# Patient Record
Sex: Male | Born: 1995 | Race: White | Hispanic: No | Marital: Single
Health system: Southern US, Community
[De-identification: ages and names within clinical notes are randomized; demographics above are authoritative.]

## PROBLEM LIST (undated history)

## (undated) DIAGNOSIS — Z8659 Personal history of other mental and behavioral disorders: Secondary | ICD-10-CM

---
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
## 2016-08-22 ENCOUNTER — Other Ambulatory Visit: Payer: Self-pay | Admitting: Physician Assistant

## 2016-08-22 DIAGNOSIS — R519 Headache, unspecified: Secondary | ICD-10-CM

## 2016-08-22 DIAGNOSIS — R51 Headache: Principal | ICD-10-CM

## 2016-09-14 ENCOUNTER — Ambulatory Visit
Admission: RE | Admit: 2016-09-14 | Discharge: 2016-09-14 | Disposition: A | Discharge status: Home / Self Care | Payer: Medicaid Other | Source: Ambulatory Visit | Attending: Physician Assistant | Admitting: Physician Assistant

## 2016-09-14 DIAGNOSIS — R51 Headache: Principal | ICD-10-CM

## 2016-09-14 DIAGNOSIS — R519 Headache, unspecified: Secondary | ICD-10-CM

## 2018-01-30 ENCOUNTER — Ambulatory Visit
Admission: RE | Admit: 2018-01-30 | Discharge: 2018-01-30 | Disposition: A | Discharge status: Home / Self Care | Payer: Self-pay | Source: Ambulatory Visit | Attending: Physician Assistant | Admitting: Physician Assistant

## 2018-01-30 ENCOUNTER — Other Ambulatory Visit: Payer: Self-pay | Admitting: Physician Assistant

## 2018-01-30 DIAGNOSIS — R0789 Other chest pain: Secondary | ICD-10-CM

## 2021-03-21 ENCOUNTER — Other Ambulatory Visit: Payer: Self-pay

## 2021-03-21 ENCOUNTER — Ambulatory Visit (HOSPITAL_COMMUNITY)
Admission: RE | Admit: 2021-03-21 | Discharge: 2021-03-21 | Discharge status: Against Medical Advice | Payer: Self-pay | Source: Ambulatory Visit

## 2021-10-19 ENCOUNTER — Other Ambulatory Visit: Payer: Self-pay

## 2021-10-19 ENCOUNTER — Emergency Department (HOSPITAL_COMMUNITY)
Admission: EM | Admit: 2021-10-19 | Discharge: 2021-10-20 | Discharge status: Against Medical Advice | Payer: BC Managed Care – PPO | Attending: Emergency Medicine | Admitting: Emergency Medicine

## 2021-10-19 ENCOUNTER — Emergency Department (HOSPITAL_COMMUNITY): Payer: BC Managed Care – PPO

## 2021-10-19 ENCOUNTER — Encounter (HOSPITAL_COMMUNITY): Payer: Self-pay | Admitting: Emergency Medicine

## 2021-10-19 DIAGNOSIS — R002 Palpitations: Secondary | ICD-10-CM | POA: Insufficient documentation

## 2021-10-19 DIAGNOSIS — Z5321 Procedure and treatment not carried out due to patient leaving prior to being seen by health care provider: Secondary | ICD-10-CM | POA: Diagnosis not present

## 2021-10-19 DIAGNOSIS — R350 Frequency of micturition: Secondary | ICD-10-CM | POA: Insufficient documentation

## 2021-10-19 DIAGNOSIS — R0602 Shortness of breath: Secondary | ICD-10-CM | POA: Diagnosis not present

## 2021-10-19 LAB — BASIC METABOLIC PANEL
Anion gap: 9 (ref 5–15)
BUN: 15 mg/dL (ref 6–20)
CO2: 26 mmol/L (ref 22–32)
Calcium: 9.8 mg/dL (ref 8.9–10.3)
Chloride: 104 mmol/L (ref 98–111)
Creatinine, Ser: 1.05 mg/dL (ref 0.61–1.24)
GFR, Estimated: >60 mL/min (ref >60)
Glucose, Bld: 84 mg/dL (ref 70–99)
Potassium: 4.4 mmol/L (ref 3.5–5.1)
Sodium: 139 mmol/L (ref 135–145)

## 2021-10-19 LAB — TROPONIN I (HIGH SENSITIVITY): Troponin I (High Sensitivity): 4 ng/L (ref <18)

## 2021-10-19 LAB — CBC WITH DIFFERENTIAL/PLATELET
Abs Immature Granulocytes: 0.03 10*3/uL (ref 0.00–0.07)
Basophils Absolute: 0 10*3/uL (ref 0.0–0.1)
Basophils Relative: 0 %
Eosinophils Absolute: 0.1 10*3/uL (ref 0.0–0.5)
Eosinophils Relative: 2 %
HCT: 45.1 % (ref 39.0–52.0)
Hemoglobin: 15.6 g/dL (ref 13.0–17.0)
Immature Granulocytes: 0 %
Lymphocytes Relative: 32 %
Lymphs Abs: 3 10*3/uL (ref 0.7–4.0)
MCH: 31.1 pg (ref 26.0–34.0)
MCHC: 34.6 g/dL (ref 30.0–36.0)
MCV: 89.8 fL (ref 80.0–100.0)
Monocytes Absolute: 0.7 10*3/uL (ref 0.1–1.0)
Monocytes Relative: 7 %
Neutro Abs: 5.5 10*3/uL (ref 1.7–7.7)
Neutrophils Relative %: 59 %
Platelets: 191 10*3/uL (ref 150–400)
RBC: 5.02 MIL/uL (ref 4.22–5.81)
RDW: 12.5 % (ref 11.5–15.5)
WBC: 9.4 10*3/uL (ref 4.0–10.5)
nRBC: 0 % (ref 0.0–0.2)

## 2021-10-19 LAB — URINALYSIS, ROUTINE W REFLEX MICROSCOPIC
Bilirubin Urine: NEGATIVE
Glucose, UA: NEGATIVE mg/dL
Hgb urine dipstick: NEGATIVE
Ketones, ur: NEGATIVE mg/dL
Leukocytes,Ua: NEGATIVE
Nitrite: NEGATIVE
Protein, ur: NEGATIVE mg/dL
Specific Gravity, Urine: 1.004 — ABNORMAL LOW (ref 1.005–1.030)
pH: 8 (ref 5.0–8.0)

## 2021-10-19 LAB — BRAIN NATRIURETIC PEPTIDE: B Natriuretic Peptide: 13.8 pg/mL (ref 0.0–100.0)

## 2021-10-19 NOTE — ED Provider Triage Note (Signed)
Emergency Medicine Provider Triage Evaluation Note ? ?Clayton Morris , a 26 y.o. male  was evaluated in triage.  Pt complains of palpitations and shortness of breath for the last 2 days.  Denies chest pain.  Began yesterday suddenly without obvious cause.  Waxed and waned until today.  Increased again, causing patient to feel anxious and come to the ED.  Denies fever, nausea, vomiting, diarrhea, constipation, abdominal pain, vision changes, urinary symptoms, headache.  No significant cardiac history. ? ?Review of Systems  ?Positive: As above ?Negative: As above ? ?Physical Exam  ?BP (!) 145/95   Pulse 74   Temp 98.8 ?F (37.1 ?C) (Oral)   Resp 18   Ht 5\' 11"  (1.803 m)   Wt 106.6 kg   SpO2 100%   BMI 32.78 kg/m?  ?Gen:   Awake, no distress, anxious appearing ?Resp:  Normal effort, CTAB, able to communicate clearly and without difficulty ?MSK:   Moves extremities without difficulty  ?Other:  Abdomen soft nontender.  No lower extremity edema.  Radial pulses 2+ bilaterally.  Pulse rate between 70 to 80 bpm.  RRR without M/R/G. ? ?Medical Decision Making  ?Medically screening exam initiated at 9:05 PM.  Appropriate orders placed.  Clayton Morris was informed that the remainder of the evaluation will be completed by another provider, this initial triage assessment does not replace that evaluation, and the importance of remaining in the ED until their evaluation is complete. ? ?Labs, imaging, EKG ordered. ?  ?Clayton Coy, PA-C ?10/19/21 2113 ? ?

## 2021-10-19 NOTE — ED Triage Notes (Signed)
Pt c/o heart palpitations intermittently x 1 days with SOB, pt reports urinary frequency x 1 hour; pt reports he recently started going to the gym and working out ?

## 2021-10-20 LAB — TROPONIN I (HIGH SENSITIVITY): Troponin I (High Sensitivity): 5 ng/L (ref <18)

## 2023-07-13 IMAGING — DX DG CHEST 2V
2 series · 2 of 2 positions shown · non-contrast
Comparison: Chest x-ray 01/30/2018.

CLINICAL DATA: Shortness of breath.

EXAM:
CHEST - 2 VIEW

[chest pa]
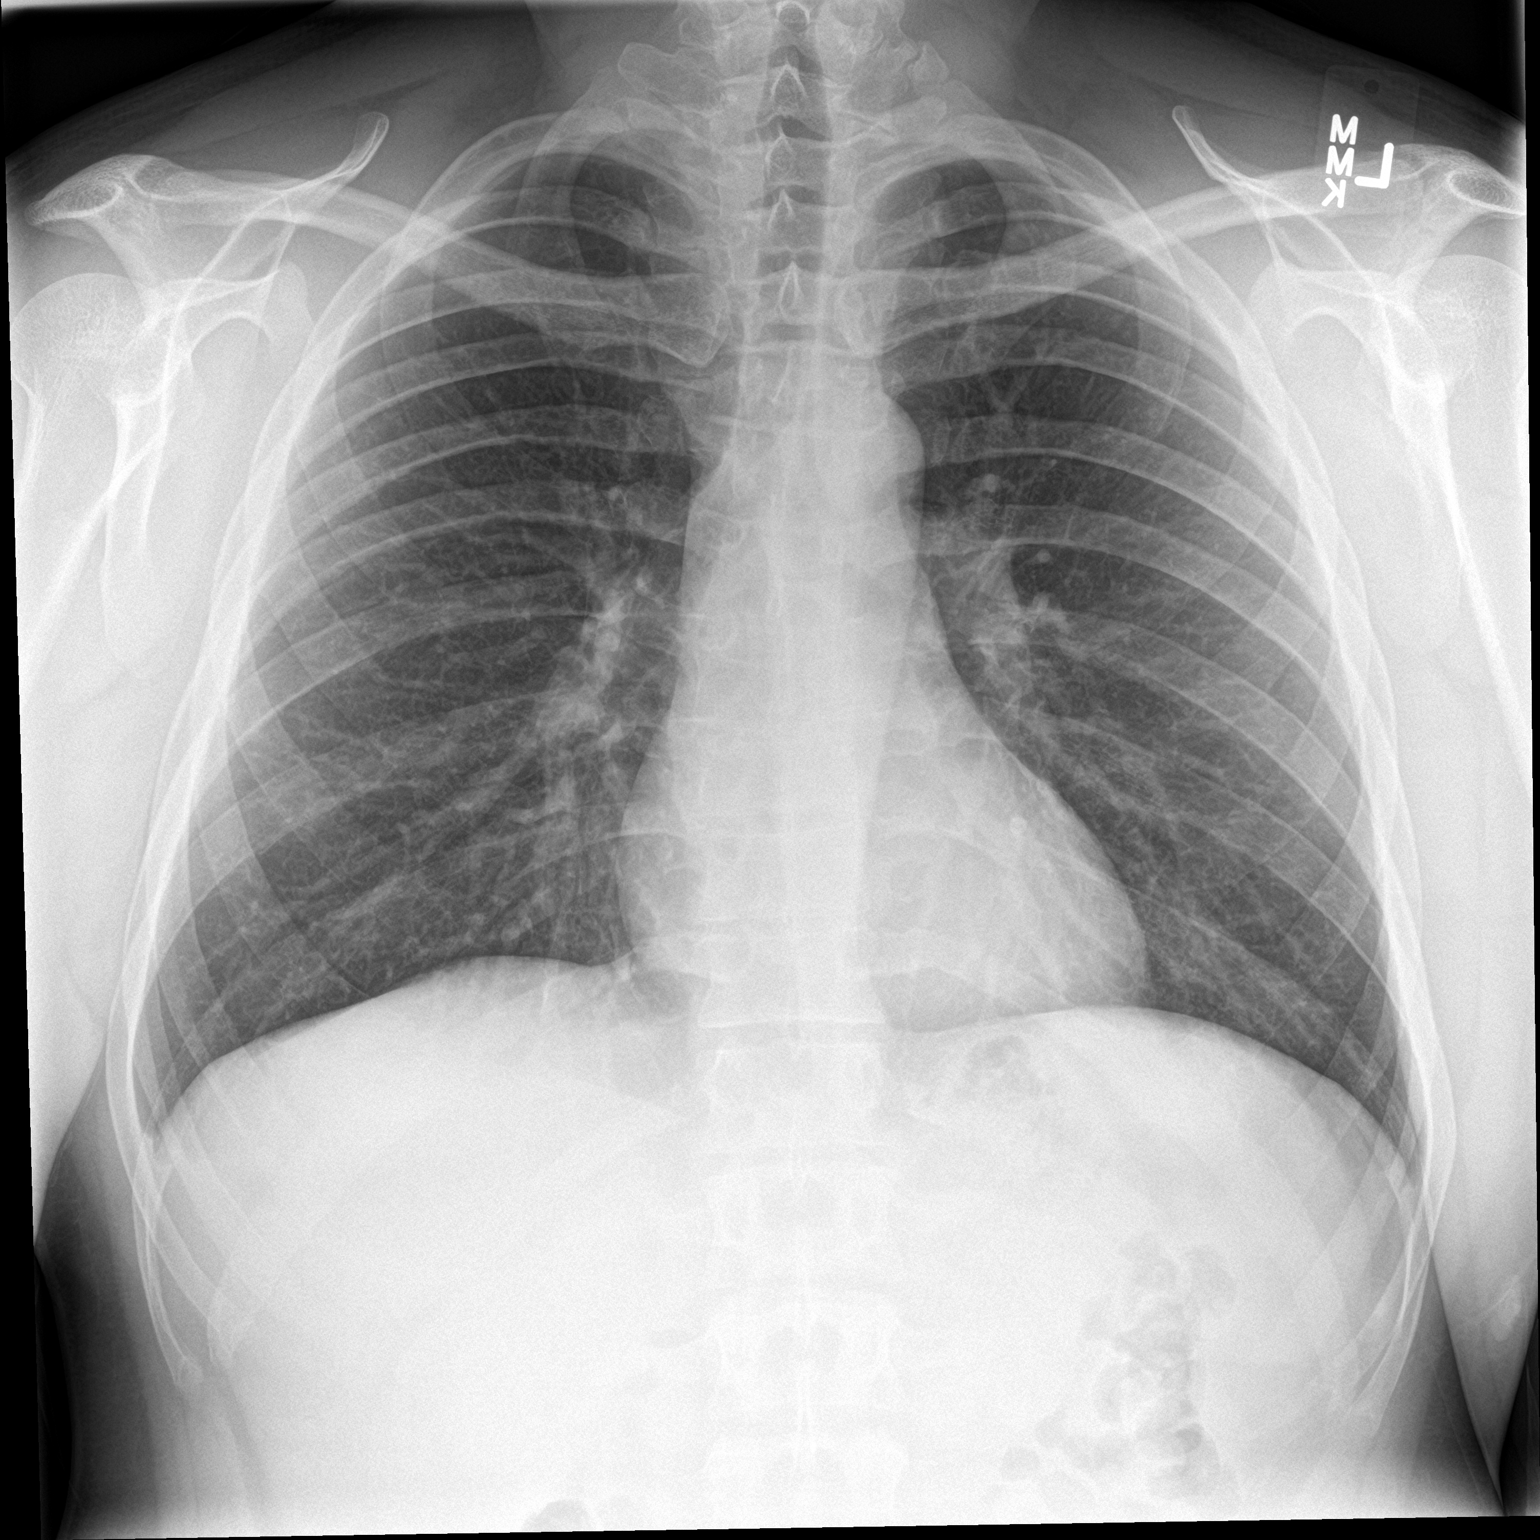

[chest lat]
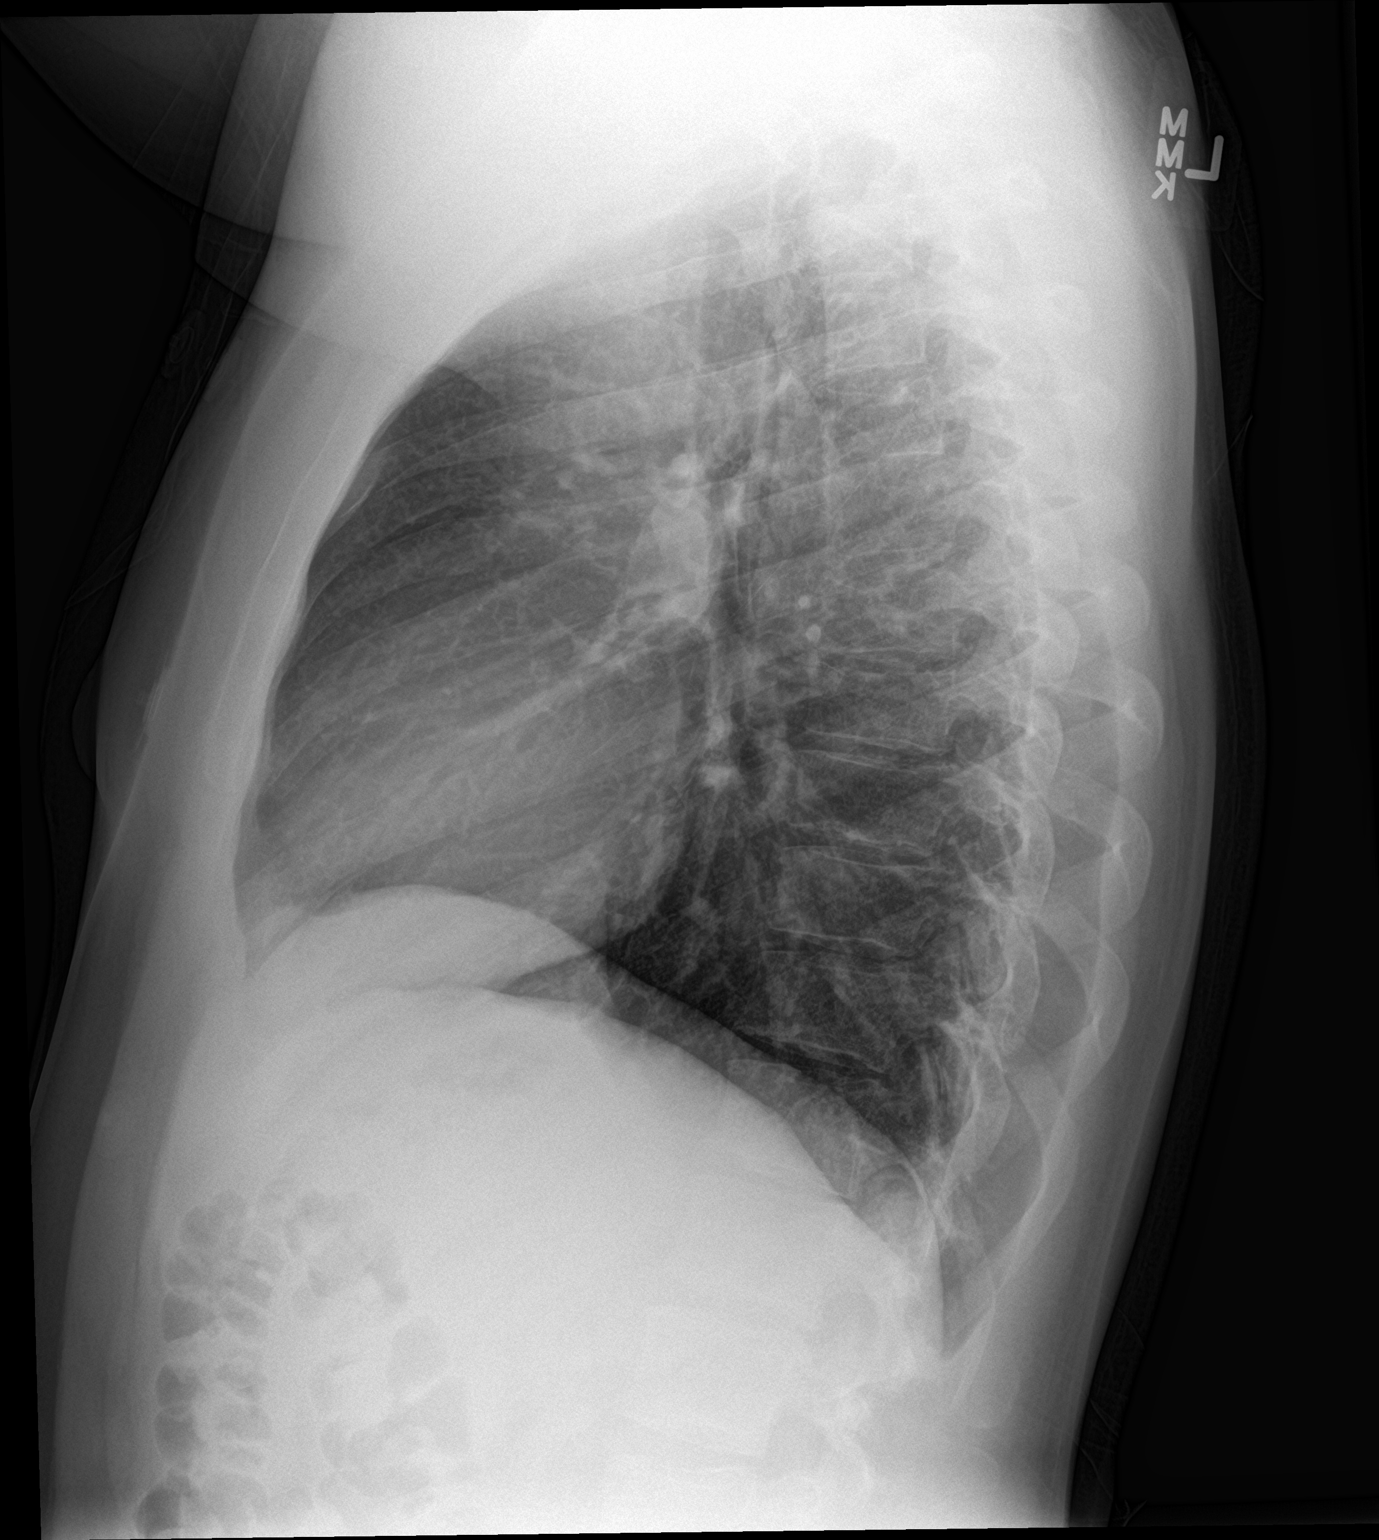

[2 of 2 positions shown; findings below may reference images not displayed]

FINDINGS: The heart size and mediastinal contours are within normal limits.
Both lungs are clear. The visualized skeletal structures are
unremarkable.
IMPRESSION: No active cardiopulmonary disease.
